# Patient Record
Sex: Female | Born: 1995 | Race: White | Hispanic: No | Marital: Single | Smoking: Never smoker
Health system: Southern US, Community
[De-identification: ages and names within clinical notes are randomized; demographics above are authoritative.]

---

## 2019-09-17 ENCOUNTER — Other Ambulatory Visit: Payer: Self-pay

## 2019-09-19 ENCOUNTER — Other Ambulatory Visit: Payer: Self-pay

## 2019-09-20 ENCOUNTER — Other Ambulatory Visit (HOSPITAL_COMMUNITY)
Admission: RE | Admit: 2019-09-20 | Discharge: 2019-09-20 | Disposition: A | Discharge status: Home / Self Care | Payer: BLUE CROSS/BLUE SHIELD | Source: Ambulatory Visit | Attending: Obstetrics and Gynecology | Admitting: Obstetrics and Gynecology

## 2019-09-20 ENCOUNTER — Encounter: Payer: Self-pay | Admitting: Obstetrics and Gynecology

## 2019-09-20 ENCOUNTER — Ambulatory Visit: Payer: BLUE CROSS/BLUE SHIELD | Admitting: Obstetrics and Gynecology

## 2019-09-20 VITALS — BP 116/60 | HR 72 | Temp 97.7°F | Resp 12 | Ht 68.5 in | Wt 135.0 lb

## 2019-09-20 DIAGNOSIS — Z0000 Encounter for general adult medical examination without abnormal findings: Secondary | ICD-10-CM | POA: Diagnosis not present

## 2019-09-20 DIAGNOSIS — Z113 Encounter for screening for infections with a predominantly sexual mode of transmission: Secondary | ICD-10-CM

## 2019-09-20 DIAGNOSIS — Z01419 Encounter for gynecological examination (general) (routine) without abnormal findings: Secondary | ICD-10-CM

## 2019-09-20 DIAGNOSIS — Z87898 Personal history of other specified conditions: Secondary | ICD-10-CM

## 2019-09-20 DIAGNOSIS — Z124 Encounter for screening for malignant neoplasm of cervix: Secondary | ICD-10-CM

## 2019-09-20 DIAGNOSIS — N914 Secondary oligomenorrhea: Secondary | ICD-10-CM

## 2019-09-20 MED ORDER — DROSPIRENONE-ETHINYL ESTRADIOL 3-0.02 MG PO TABS: 1.0000 | ORAL_TABLET | Freq: Every day | ORAL | 0 refills | Status: DC

## 2019-09-20 NOTE — Progress Notes (Signed)
23 y.o. G0P0000 Single White or Caucasian Not Hispanic or Latino female here as a new patient to discuss birth control. Patient has seen GYN in [facility] August 2019. Due for annual. Patient has irregular menses. Was taking Balziva in the past.  Sexually active, same partner x 1 month. Using condoms, no dyspareunia. Menses q 1-2 months, got irregular in the last few years. Eating normally, not excessively exercising. She has noticed some clear nipple d/c thinks from both sides. She has noticed some fluid on her bra or shirt. Started ~6 months ago.  +constipation. No other thyroid c/o.  Period Cycle (Days): (irregular) Period Duration (Days): 5 Period Pattern: (!) Irregular Menstrual Flow: Moderate Menstrual Control: Thin pad Menstrual Control Change Freq (Hours): 5 Dysmenorrhea: (!) Moderate Dysmenorrhea Symptoms: Cramping, Throbbing, Nausea, Diarrhea, Headache  Prior to her cycle she has moderate cramping, nausea, diarrhea or headache. On prior OCP's she had 2 weeks of spotting a month.   Patient's last menstrual period was 09/18/2019.          Sexually active: Yes.    The current method of family planning is condoms all of the time per patient.    Exercising: Yes.    workouts Smoker:  no  Health Maintenance: Pap:  never History of abnormal Pap:  n/a Colonoscopy: about 2013 normal  TDaP:  UTD Gardasil: completed series   reports that she has never smoked. She has never used smokeless tobacco. She reports current alcohol use of about 3.0 - 4.0 standard drinks of alcohol per week. She reports that she does not use drugs. 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 From [facility], [place].   History reviewed. No pertinent past medical history.  History reviewed. No pertinent surgical history.  No current outpatient medications on file.   No current facility-administered medications for this visit.     Family History  Problem Relation Age of Onset  . Cancer Maternal  Grandmother   . Cancer Maternal Grandfather   . Diabetes Paternal Grandmother   GM common bile duct cancer.   Review of Systems  Constitutional: Negative.   HENT: Negative.   Eyes: Negative.   Respiratory: Negative.   Cardiovascular: Negative.   Gastrointestinal: Negative.   Endocrine: Negative.   Genitourinary: Negative.   Musculoskeletal: Negative.   Skin: Negative.   Allergic/Immunologic: Negative.   Neurological: Negative.   Hematological: Negative.   Psychiatric/Behavioral: Negative.     Exam:   BP 116/60 (BP Location: Right Arm, Patient Position: Sitting, Cuff Size: Normal)   Pulse 72   Temp 97.7 F (36.5 C) (Temporal)   Resp 12   Ht 5' 8.5" (1.74 m)   Wt 135 lb (61.2 kg)   LMP 09/18/2019   BMI 20.23 kg/m   Weight change: @WEIGHTCHANGE @ Height:   Height: 5' 8.5" (174 cm)  Ht Readings from Last 3 Encounters:  09/20/19 5' 8.5" (1.74 m)    General appearance: alert, cooperative and appears stated age Head: Normocephalic, without obvious abnormality, atraumatic Neck: no adenopathy, supple, symmetrical, trachea midline and thyroid normal to inspection and palpation Lungs: clear to auscultation bilaterally Cardiovascular: regular rate and rhythm Breasts: normal appearance, no masses or tenderness, patient unable to express any discharge Abdomen: soft, non-tender; non distended,  no masses,  no organomegaly Extremities: extremities normal, atraumatic, no cyanosis or edema Skin: Skin color, texture, turgor normal. No rashes or lesions Lymph nodes: Cervical, supraclavicular, and axillary nodes normal. No abnormal inguinal nodes palpated Neurologic: Grossly normal   Pelvic:  External genitalia:  no lesions              Urethra:  normal appearing urethra with no masses, tenderness or lesions              Bartholins and Skenes: normal                 Vagina: normal appearing vagina with normal color and discharge, no lesions              Cervix: no lesions                Bimanual Exam:  Uterus:  normal size, contour, position, consistency, mobility, non-tender              Adnexa: no mass, fullness, tenderness               Rectovaginal: Confirms               Anus:  normal sphincter tone, no lesions  Chaperone was present for exam.  A:  Well Woman with normal exam  Contraception counseling  Oligomenorrhea  Nipple d/c, clear, thinks bilateral. Unable to express  P:   UPT negative  Will try Yaz  Discussed risks of OCP's, possible increased risk of clots with Yaz. No contraindications to OCP's.  Continue to use condoms for STD protection  Prolactin, TSH  Screening labs  STD  Pap  Discussed breast self exam  Discussed calcium and vit D intake

## 2019-09-20 NOTE — Patient Instructions (Addendum)
EXERCISE AND DIET:  We recommended that you start or continue a regular exercise program for good health. Regular exercise means any activity that makes your heart beat faster and makes you sweat.  We recommend exercising at least 30 minutes per day at least 3 days a week, preferably 4 or 5.  We also recommend a diet low in fat and sugar.  Inactivity, poor dietary choices and obesity can cause diabetes, heart attack, stroke, and kidney damage, among others.   ° °ALCOHOL AND SMOKING:  Women should limit their alcohol intake to no more than 7 drinks/beers/glasses of wine (combined, not each!) per week. Moderation of alcohol intake to this level decreases your risk of breast cancer and liver damage. And of course, no recreational drugs are part of a healthy lifestyle.  And absolutely no smoking or even second hand smoke. Most people know smoking can cause heart and lung diseases, but did you know it also contributes to weakening of your bones? Aging of your skin?  Yellowing of your teeth and nails? ° °CALCIUM AND VITAMIN D:  Adequate intake of calcium and Vitamin D are recommended.  The recommendations for exact amounts of these supplements seem to change often, but generally speaking 1,000 mg of calcium (between diet and supplement) and 800 units of Vitamin D per day seems prudent. Certain women may benefit from higher intake of Vitamin D.  If you are among these women, your doctor will have told you during your visit.   ° °PAP SMEARS:  Pap smears, to check for cervical cancer or precancers,  have traditionally been done yearly, although recent scientific advances have shown that most women can have pap smears less often.  However, every woman still should have a physical exam from her gynecologist every year. It will include a breast check, inspection of the vulva and vagina to check for abnormal growths or skin changes, a visual exam of the cervix, and then an exam to evaluate the size and shape of the uterus and  ovaries.  And after 21 years of age, a rectal exam is indicated to check for rectal cancers. We will also provide age appropriate advice regarding health maintenance, like when you should have certain vaccines, screening for sexually transmitted diseases, bone density testing, colonoscopy, mammograms, etc.  ° °MAMMOGRAMS:  All women over 40 years old should have a yearly mammogram. Many facilities now offer a "3D" mammogram, which may cost around $50 extra out of pocket. If possible,  we recommend you accept the option to have the 3D mammogram performed.  It both reduces the number of women who will be called back for extra views which then turn out to be normal, and it is better than the routine mammogram at detecting truly abnormal areas.   ° °COLON CANCER SCREENING: Now recommend starting at age 45. At this time colonoscopy is not covered for routine screening until 50. There are take home tests that can be done between 45-49.  ° °COLONOSCOPY:  Colonoscopy to screen for colon cancer is recommended for all women at age 50.  We know, you hate the idea of the prep.  We agree, BUT, having colon cancer and not knowing it is worse!!  Colon cancer so often starts as a polyp that can be seen and removed at colonscopy, which can quite literally save your life!  And if your first colonoscopy is normal and you have no family history of colon cancer, most women don't have to have it again for   10 years.  Once every ten years, you can do something that may end up saving your life, right?  We will be happy to help you get it scheduled when you are ready.  Be sure to check your insurance coverage so you understand how much it will cost.  It may be covered as a preventative service at no cost, but you should check your particular policy.   ° ° ° °Breast Self-Awareness °Breast self-awareness means being familiar with how your breasts look and feel. It involves checking your breasts regularly and reporting any changes to your  health care provider. °Practicing breast self-awareness is important. A change in your breasts can be a sign of a serious medical problem. Being familiar with how your breasts look and feel allows you to find any problems early, when treatment is more likely to be successful. All women should practice breast self-awareness, including women who have had breast implants. °How to do a breast self-exam °One way to learn what is normal for your breasts and whether your breasts are changing is to do a breast self-exam. To do a breast self-exam: °Look for Changes ° °1. Remove all the clothing above your waist. °2. Stand in front of a mirror in a room with good lighting. °3. Put your hands on your hips. °4. Push your hands firmly downward. °5. Compare your breasts in the mirror. Look for differences between them (asymmetry), such as: °? Differences in shape. °? Differences in size. °? Puckers, dips, and bumps in one breast and not the other. °6. Look at each breast for changes in your skin, such as: °? Redness. °? Scaly areas. °7. Look for changes in your nipples, such as: °? Discharge. °? Bleeding. °? Dimpling. °? Redness. °? A change in position. °Feel for Changes °Carefully feel your breasts for lumps and changes. It is best to do this while lying on your back on the floor and again while sitting or standing in the shower or tub with soapy water on your skin. Feel each breast in the following way: °· Place the arm on the side of the breast you are examining above your head. °· Feel your breast with the other hand. °· Start in the nipple area and make ¾ inch (2 cm) overlapping circles to feel your breast. Use the pads of your three middle fingers to do this. Apply light pressure, then medium pressure, then firm pressure. The light pressure will allow you to feel the tissue closest to the skin. The medium pressure will allow you to feel the tissue that is a little deeper. The firm pressure will allow you to feel the tissue  close to the ribs. °· Continue the overlapping circles, moving downward over the breast until you feel your ribs below your breast. °· Move one finger-width toward the center of the body. Continue to use the ¾ inch (2 cm) overlapping circles to feel your breast as you move slowly up toward your collarbone. °· Continue the up and down exam using all three pressures until you reach your armpit. ° °Write Down What You Find ° °Write down what is normal for each breast and any changes that you find. Keep a written record with breast changes or normal findings for each breast. By writing this information down, you do not need to depend only on memory for size, tenderness, or location. Write down where you are in your menstrual cycle, if you are still menstruating. °If you are having trouble noticing differences   in your breasts, do not get discouraged. With time you will become more familiar with the variations in your breasts and more comfortable with the exam. °How often should I examine my breasts? °Examine your breasts every month. If you are breastfeeding, the best time to examine your breasts is after a feeding or after using a breast pump. If you menstruate, the best time to examine your breasts is 5-7 days after your period is over. During your period, your breasts are lumpier, and it may be more difficult to notice changes. °When should I see my health care provider? °See your health care provider if you notice: °· A change in shape or size of your breasts or nipples. °· A change in the skin of your breast or nipples, such as a reddened or scaly area. °· Unusual discharge from your nipples. °· A lump or thick area that was not there before. °· Pain in your breasts. °· Anything that concerns you. ° °Oral Contraception Information °Oral contraceptive pills (OCPs) are medicines taken to prevent pregnancy. OCPs are taken by mouth, and they work by: °· Preventing the ovaries from releasing eggs. °· Thickening mucus in  the lower part of the uterus (cervix), which prevents sperm from entering the uterus. °· Thinning the lining of the uterus (endometrium), which prevents a fertilized egg from attaching to the endometrium. °OCPs are highly effective when taken exactly as prescribed. However, OCPs do not prevent STIs (sexually transmitted infections). Safe sex practices, such as using condoms while on an OCP, can help prevent STIs. °Before starting OCPs °Before you start taking OCPs, you may have a physical exam, blood test, and Pap test. However, you are not required to have a pelvic exam in order to be prescribed OCPs. Your health care provider will make sure you are a good candidate for oral contraception. OCPs are not a good option for certain women, including women who smoke and are older than 35 years, and women with a medical history of high blood pressure, deep vein thrombosis, pulmonary embolism, stroke, cardiovascular disease, or peripheral vascular disease. °Discuss with your health care provider the possible side effects of the OCP you may be prescribed. When you start an OCP, be aware that it can take 2-3 months for your body to adjust to changes in hormone levels. °Follow instructions from your health care provider about how to start taking your first cycle of OCPs. Depending on when you start the pill, you may need to use a backup form of birth control, such as condoms, during the first week. Make sure you know what steps to take if you ever forget to take the pill. °Types of oral contraception ° °The most common types of birth control pills contain the hormones estrogen and progestin (synthetic progesterone) or progestin only. °The combination pill °This type of pill contains estrogen and progestin hormones. Combination pills often come in packs of 21, 28, or 91 pills. For each pack, the last 7 pills may not contain hormones, which means you may stop taking the pills for 7 days. Menstrual bleeding occurs during the  week that you do not take the pills or that you take the pills with no hormones in them. °The minipill °This type of pill contains the progestin hormone only. It comes in packs of 28 pills. All 28 pills contain the hormone. You take the pill every day. It is very important to take the pill at the same time each day. °Advantages of oral contraceptive pills °·   Provides reliable and continuous contraception if taken as instructed. °· May treat or decrease symptoms of: °? Menstrual period cramps. °? Irregular menstrual cycle or bleeding. °? Heavy menstrual flow. °? Abnormal uterine bleeding. °? Acne, depending on the type of pill. °? Polycystic ovarian syndrome. °? Endometriosis. °? Iron deficiency anemia. °? Premenstrual symptoms, including premenstrual dysphoric disorder. °· May reduce the risk of endometrial and ovarian cancer. °· Can be used as emergency contraception. °· Prevents mislocated (ectopic) pregnancies and infections of the fallopian tubes. °Things that can make oral contraceptive pills less effective °OCPs can be less effective if: °· You forget to take the pill at the same time every day. This is especially important when taking the minipill. °· You have a stomach or intestinal disease that reduces your body's ability to absorb the pill. °· You take OCPs with other medicines that make OCPs less effective, such as antibiotics, certain HIV medicines, and some seizure medicines. °· You take expired OCPs. °· You forget to restart the pill on day 7, if using the packs of 21 pills. °Risks associated with oral contraceptive pills °Oral contraceptive pills can sometimes cause side effects, such as: °· Headache. °· Depression. °· Trouble sleeping. °· Nausea and vomiting. °· Breast tenderness. °· Irregular bleeding or spotting during the first several months. °· Bloating or fluid retention. °· Increase in blood pressure. °Combination pills are also associated with a small increase in the risk of: °· Blood  clots. °· Heart attack. °· Stroke. °Summary °· Oral contraceptive pills are medicines taken by mouth to prevent pregnancy. They are highly effective when taken exactly as prescribed. °· The most common types of birth control pills contain the hormones estrogen and progestin (synthetic progesterone) or progestin only. °· Before you start taking the pill, you may have a physical exam, blood test, and Pap test. Your health care provider will make sure you are a good candidate for oral contraception. °· The combination pill may come in a 21-day pack, a 28-day pack, or a 91-day pack. The minipill contains the progesterone hormone only and comes in packs of 28 pills. °· Oral contraceptive pills can sometimes cause side effects, such as headache, nausea, breast tenderness, or irregular bleeding. °This information is not intended to replace advice given to you by your health care provider. Make sure you discuss any questions you have with your health care provider. °Document Released: 02/19/2003 Document Revised: 11/11/2017 Document Reviewed: 02/22/2017 °Elsevier Patient Education © 2020 Elsevier Inc. ° °

## 2019-09-21 LAB — TSH: TSH: 2.07 u[IU]/mL (ref 0.450–4.500)

## 2019-09-21 LAB — RPR: RPR Ser Ql: NONREACTIVE

## 2019-09-21 LAB — CBC
Hematocrit: 38.9 % (ref 34.0–46.6)
Hemoglobin: 12.7 g/dL (ref 11.1–15.9)
MCH: 30.3 pg (ref 26.6–33.0)
MCHC: 32.6 g/dL (ref 31.5–35.7)
MCV: 93 fL (ref 79–97)
Platelets: 286 10*3/uL (ref 150–450)
RBC: 4.19 x10E6/uL (ref 3.77–5.28)
RDW: 12.9 % (ref 11.7–15.4)
WBC: 5.2 10*3/uL (ref 3.4–10.8)

## 2019-09-21 LAB — COMPREHENSIVE METABOLIC PANEL
ALT: 12 IU/L (ref 0–32)
AST: 18 IU/L (ref 0–40)
Albumin/Globulin Ratio: 1.9 (ref 1.2–2.2)
Albumin: 4.6 g/dL (ref 3.9–5.0)
Alkaline Phosphatase: 45 IU/L (ref 39–117)
BUN/Creatinine Ratio: 12 (ref 9–23)
BUN: 10 mg/dL (ref 6–20)
Bilirubin Total: 0.3 mg/dL (ref 0.0–1.2)
CO2: 24 mmol/L (ref 20–29)
Calcium: 10.1 mg/dL (ref 8.7–10.2)
Chloride: 101 mmol/L (ref 96–106)
Creatinine, Ser: 0.81 mg/dL (ref 0.57–1.00)
GFR calc Af Amer: 120 mL/min/{1.73_m2} (ref >59)
GFR calc non Af Amer: 104 mL/min/{1.73_m2} (ref >59)
Globulin, Total: 2.4 g/dL (ref 1.5–4.5)
Glucose: 92 mg/dL (ref 65–99)
Potassium: 4.6 mmol/L (ref 3.5–5.2)
Sodium: 139 mmol/L (ref 134–144)
Total Protein: 7 g/dL (ref 6.0–8.5)

## 2019-09-21 LAB — LIPID PANEL
Chol/HDL Ratio: 3 ratio (ref 0.0–4.4)
Cholesterol, Total: 169 mg/dL (ref 100–199)
HDL: 57 mg/dL (ref >39)
LDL Chol Calc (NIH): 93 mg/dL (ref 0–99)
Triglycerides: 106 mg/dL (ref 0–149)
VLDL Cholesterol Cal: 19 mg/dL (ref 5–40)

## 2019-09-21 LAB — PROLACTIN: Prolactin: 57 ng/mL — ABNORMAL HIGH (ref 4.8–23.3)

## 2019-09-21 LAB — HIV ANTIBODY (ROUTINE TESTING W REFLEX): HIV Screen 4th Generation wRfx: NONREACTIVE

## 2019-09-24 ENCOUNTER — Telehealth: Payer: Self-pay

## 2019-09-24 DIAGNOSIS — N914 Secondary oligomenorrhea: Secondary | ICD-10-CM

## 2019-09-24 DIAGNOSIS — R7989 Other specified abnormal findings of blood chemistry: Secondary | ICD-10-CM

## 2019-09-24 DIAGNOSIS — Z87898 Personal history of other specified conditions: Secondary | ICD-10-CM

## 2019-09-24 NOTE — Telephone Encounter (Signed)
-----   Message from Salvadore Dom, MD sent at 09/21/2019  3:42 PM EDT ----- Please let the patient know that her prolactin level is elevated, this goes along with her irregular menses and nipple discharge. Prolactin is a hormone secreted from the brain. When the level is elevated we check a MRI to look for a benign tumor (typically just treated with medication). Please set her up for an MRI of her pituitary with contrast.  The rest of her blood work is normal. The pap and cervical cultures are pending.

## 2019-09-24 NOTE — Telephone Encounter (Signed)
Left message to call Kaitlyn at [phone]. 

## 2019-09-26 NOTE — Telephone Encounter (Signed)
Patient placed in IMG hold.  

## 2019-09-26 NOTE — Telephone Encounter (Signed)
Spoke with patient. Results given. Patient verbalizes understanding. Order for Brain MRI W WO contrast placed for preauthorization and scheduling. Patient is aware she will be contacted directly to schedule appointment.  Cc: Lerry Liner for preauthorization Cc: Glorianne Manchester, RN for imaging hold  Routing to provider and will close encounter.

## 2019-10-01 LAB — CYTOLOGY - PAP
Chlamydia: NEGATIVE
Comment: NEGATIVE
Comment: NEGATIVE
Comment: NORMAL
Diagnosis: NEGATIVE
Neisseria Gonorrhea: NEGATIVE
Trichomonas: NEGATIVE

## 2019-10-18 ENCOUNTER — Telehealth: Payer: Self-pay | Admitting: Obstetrics and Gynecology

## 2019-10-18 NOTE — Telephone Encounter (Signed)
Taron with [facility] called regarding appointment for patient. She is scheduled on 10/23/19 and authorization is needed. [id number].

## 2019-10-18 NOTE — Telephone Encounter (Signed)
Routing to [name].  

## 2019-10-18 NOTE — Telephone Encounter (Signed)
See previous messages. Call placed to [facility]. Per [facility] [phone] representative Caffie Pinto and Team Leader/Manager Jon Gills (call reference number [id number]) CPT code [phone] does NOT require prior approval. Vinie Sill states this information is "well documented and recorded in their files"  I have also spoken with Taron with [facility] and conveyed this information.   Will close encounter   cc: Glorianne Manchester, RN

## 2019-10-23 ENCOUNTER — Other Ambulatory Visit: Payer: Self-pay

## 2019-10-23 ENCOUNTER — Ambulatory Visit
Admission: RE | Admit: 2019-10-23 | Discharge: 2019-10-23 | Disposition: A | Discharge status: Home / Self Care | Payer: BLUE CROSS/BLUE SHIELD | Source: Ambulatory Visit | Attending: Obstetrics and Gynecology | Admitting: Obstetrics and Gynecology

## 2019-10-23 DIAGNOSIS — N914 Secondary oligomenorrhea: Secondary | ICD-10-CM

## 2019-10-23 DIAGNOSIS — Z87898 Personal history of other specified conditions: Secondary | ICD-10-CM

## 2019-10-23 DIAGNOSIS — R7989 Other specified abnormal findings of blood chemistry: Secondary | ICD-10-CM

## 2019-10-23 MED ORDER — GADOBENATE DIMEGLUMINE 529 MG/ML IV SOLN
6.0000 mL | Freq: Once | INTRAVENOUS | Status: AC | PRN
  Administered 2019-10-23: 6 mL via INTRAVENOUS

## 2019-12-05 ENCOUNTER — Other Ambulatory Visit: Payer: Self-pay | Admitting: Obstetrics and Gynecology

## 2019-12-05 ENCOUNTER — Other Ambulatory Visit: Payer: Self-pay

## 2019-12-05 MED ORDER — DROSPIRENONE-ETHINYL ESTRADIOL 3-0.02 MG PO TABS: 1.0000 | ORAL_TABLET | Freq: Every day | ORAL | 0 refills | Status: DC

## 2019-12-05 NOTE — Telephone Encounter (Signed)
Med refill request: Gabrielle Martinez Last AEX: 09/20/2019 Next AEX: not scheduled Has  3 month recheck 01/09/2020  Last MMG (if hormonal med) n/a, but had Brain MRI, negative Refill authorized: # 1 package, 0 RF orders pended if approved. Per OV in 09/20/19, pt to try Gabrielle Martinez and will follow up for more RF???

## 2019-12-05 NOTE — Telephone Encounter (Signed)
Medication refill request: Yaz Last AEX:  09/20/19 JJ Next OV: 01/09/20  Last MMG (if hormonal medication request): n/a Refill authorized: Please advise if patient needs refill.

## 2019-12-10 ENCOUNTER — Telehealth: Payer: Self-pay | Admitting: Obstetrics and Gynecology

## 2019-12-10 NOTE — Telephone Encounter (Signed)
Patient needs refill on birth control. Is out of town for the holidays and would like it sent to the [facility] at [address], [place] [address].

## 2019-12-11 NOTE — Telephone Encounter (Signed)
Left detailed message per DPR  re: Rx. Made pt aware of Rx sent for OCPs  [facility] [place]. Instructed pt to call and transfer Rx to pharmacy that she is currently needing RF. Pt to call back if any questions or concerns.  Will route to Dr Talbert Nan for review and will close encounter.

## 2019-12-31 ENCOUNTER — Other Ambulatory Visit: Payer: Self-pay | Admitting: Obstetrics and Gynecology

## 2019-12-31 NOTE — Telephone Encounter (Signed)
Medication refill request: OCP Last AEX:  09-20-2019 JJ  Next OV for follow up: 01-09-20 Last MMG (if hormonal medication request): n/a Refill authorized: Today, please advise.   Medication pended for #28, 0RF. Please refill if appropriate.

## 2020-01-09 ENCOUNTER — Telehealth (INDEPENDENT_AMBULATORY_CARE_PROVIDER_SITE_OTHER): Payer: BLUE CROSS/BLUE SHIELD | Admitting: Obstetrics and Gynecology

## 2020-01-09 ENCOUNTER — Other Ambulatory Visit: Payer: Self-pay

## 2020-01-09 ENCOUNTER — Encounter: Payer: Self-pay | Admitting: Obstetrics and Gynecology

## 2020-01-09 DIAGNOSIS — Z87898 Personal history of other specified conditions: Secondary | ICD-10-CM | POA: Diagnosis not present

## 2020-01-09 DIAGNOSIS — Z708 Other sex counseling: Secondary | ICD-10-CM | POA: Diagnosis not present

## 2020-01-09 DIAGNOSIS — Z3041 Encounter for surveillance of contraceptive pills: Secondary | ICD-10-CM

## 2020-01-09 MED ORDER — DROSPIRENONE-ETHINYL ESTRADIOL 3-0.02 MG PO TABS: 1.0000 | ORAL_TABLET | Freq: Every day | ORAL | 2 refills | Status: DC

## 2020-01-09 NOTE — Progress Notes (Signed)
Virtual Visit via Video Note  I connected with Gabrielle Martinez on 01/09/20 at 10:00 AM EST by a video enabled telemedicine application and verified that I am speaking with the correct person using two identifiers.  Location: Patient: In her apartment at school in [facility] Provider: Office at [facility]   I discussed the limitations of evaluation and management by telemedicine and the availability of in person appointments. The patient expressed understanding and agreed to proceed.  GYNECOLOGY  VISIT   HPI: 24 y.o.   Single White or Caucasian Not Hispanic or Latino  female   G0P0000 with No LMP recorded.   here for f/u medication check. She was started [place] in 10/20. She is getting a cycle at the end of her pill pack every month. She is bleeding for 3 days. It has gotten very light, only spotting. Cramps are getting better, currently minimal. She c/o mild acne on the pill (not previously). She is sexually active with the same partner, not consistently using condoms.  At her annual she was c/o oligomenorrhea and clear nipple d/c. Her prolactin level was 57. Brain MRI was normal. Her nipple d/c seems to have stopped.   GYNECOLOGIC HISTORY: No LMP recorded. Contraception:Yaz Menopausal hormone therapy: none        OB History    Gravida  0   Para  0   Term  0   Preterm  0   AB  0   Living  0     SAB  0   TAB  0   Ectopic  0   Multiple  0   Live Births  0              There are no problems to display for this patient.   No past medical history on file.  No past surgical history on file.  Current Outpatient Medications  Medication Sig Dispense Refill  . GIANVI 3-0.02 MG tablet TAKE 1 TABLET BY MOUTH EVERY DAY 28 tablet 0   No current facility-administered medications for this visit.     ALLERGIES: Patient has no known allergies.  Family History  Problem Relation Age of Onset  . Cancer Maternal Grandmother   . Cancer Maternal  Grandfather   . Diabetes Paternal Grandmother     Social History   Socioeconomic History  . Marital status: Single    Spouse name: Not on file  . Number of children: Not on file  . Years of education: Not on file  . Highest education level: Not on file  Occupational History  . Not on file  Tobacco Use  . Smoking status: Never Smoker  . Smokeless tobacco: Never Used  Substance and Sexual Activity  . Alcohol use: Yes    Alcohol/week: 3.0 - 4.0 standard drinks    Types: 3 - 4 Standard drinks or equivalent per week  . Drug use: Never  . Sexual activity: Yes    Birth control/protection: Condom  Other Topics Concern  . Not on file  Social History Narrative  . Not on file   Social Determinants of Health   Financial Resource Strain:   . Difficulty of Paying Living Expenses: Not on file  Food Insecurity:   . Worried About Programme researcher, broadcasting/film/video in the Last Year: Not on file  . Ran Out of Food in the Last Year: Not on file  Transportation Needs:   . Lack of Transportation (Medical): Not on file  . Lack of Transportation (  Non-Medical): Not on file  Physical Activity:   . Days of Exercise per Week: Not on file  . Minutes of Exercise per Session: Not on file  Stress:   . Feeling of Stress : Not on file  Social Connections:   . Frequency of Communication with Friends and Family: Not on file  . Frequency of Social Gatherings with Friends and Family: Not on file  . Attends Religious Services: Not on file  . Active Member of Clubs or Organizations: Not on file  . Attends Archivist Meetings: Not on file  . Marital Status: Not on file  Intimate Partner Violence:   . Fear of Current or Ex-Partner: Not on file  . Emotionally Abused: Not on file  . Physically Abused: Not on file  . Sexually Abused: Not on file    ROS  PHYSICAL EXAMINATION:    There were no vitals taken for this visit.    General appearance: alert, cooperative and appears stated age  Chaperone was  present for exam.  ASSESSMENT Contraception surveillance, doing well on yaz H/O elevated prolactin, nipple d/c, normal MRI. Nipple d/c has stopped Discussed condoms for STD protection   PLAN Continue Yaz Don't squeeze her nipple to check for d/c Call with any concerns Condom use was encouraged for STD protection    I discussed the assessment and treatment plan with the patient. The patient was provided an opportunity to ask questions and all were answered. The patient agreed with the plan and demonstrated an understanding of the instructions.   The patient was advised to call back or seek an in-person evaluation if the symptoms worsen or if the condition fails to improve as anticipated.  I provided over 10 minutes of total patient care  Salvadore Dom, MD

## 2020-03-20 IMAGING — MR MR HEAD WO/W CM
12 of 16 series · 35 of 48 positions shown · IV contrast (6 ml Multihance)
Comparison: None.

CLINICAL DATA: Hyperprolactinemia

EXAM:
MRI HEAD WITHOUT AND WITH CONTRAST
TECHNIQUE: Multiplanar, multiecho pulse sequences of the brain and surrounding
structures were obtained without and with intravenous contrast.
CONTRAST:  6mL MULTIHANCE GADOBENATE DIMEGLUMINE 529 MG/ML IV SOLN

[Series 4: DWI · axial · 3.0mm · 1.80mm/px · z∈[-36,+110]mm · 9 of 100 slices shown]
[im 1/100]
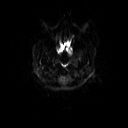
[im 17/100]
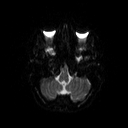
[im 34/100]
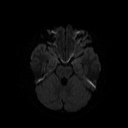
[im 42/100]
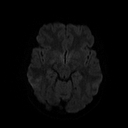
[im 50/100]
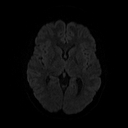
[im 58/100]
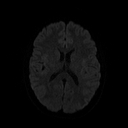
[im 67/100]
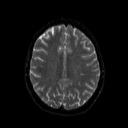
[im 83/100]
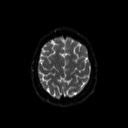
[im 100/100]
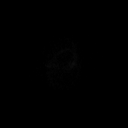

[Series 5: dwi_adc · axial · 3.0mm · 1.80mm/px · z∈[-36,+110]mm · 6 of 50 slices shown]
[im 1/50]
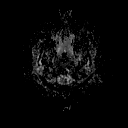
[im 10/50]
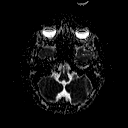
[im 20/50]
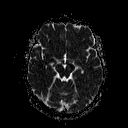
[im 30/50]
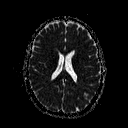
[im 40/50]
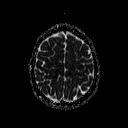
[im 50/50]
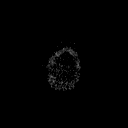

[Series 6: T2 · axial · 5.0mm · 0.36mm/px · z∈[-48,+88]mm · 3 of 22 slices shown]
[im 1/22]
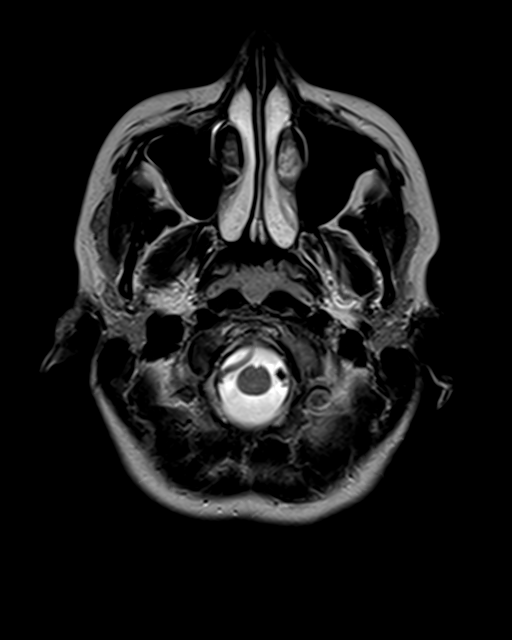
[im 11/22]
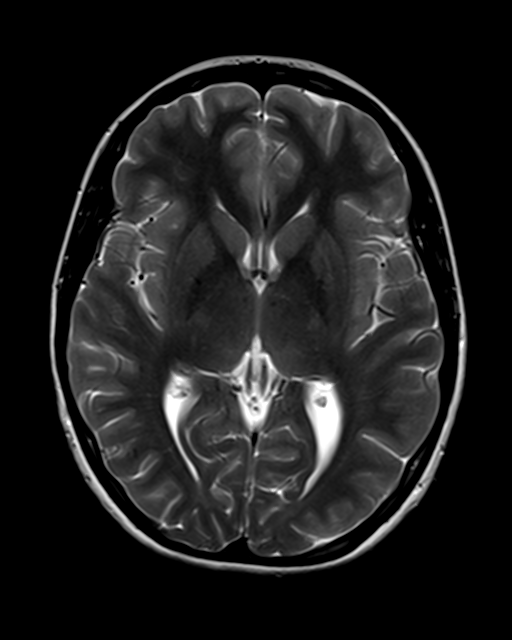
[im 22/22]
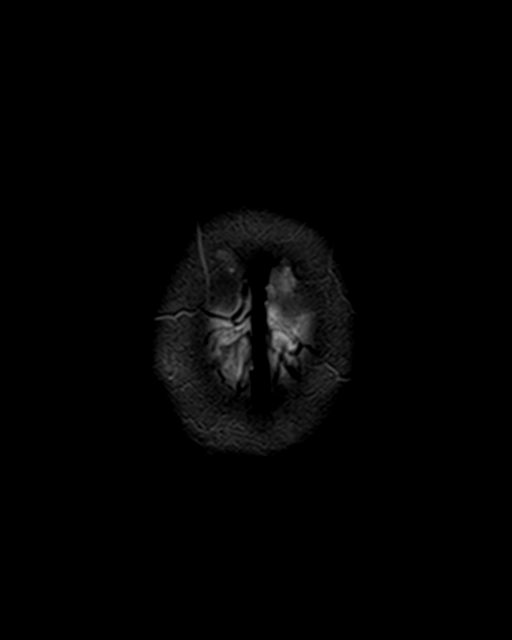

[Series 7: FLAIR · axial · 3.0mm · 0.45mm/px · z∈[-52,+91]mm · 4 of 30 slices shown]
[im 1/30]
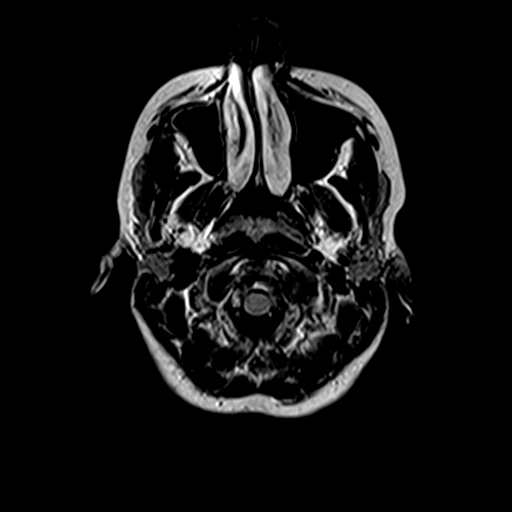
[im 10/30]
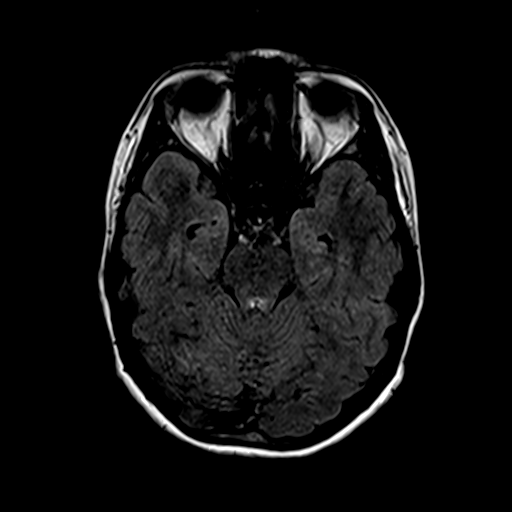
[im 20/30]
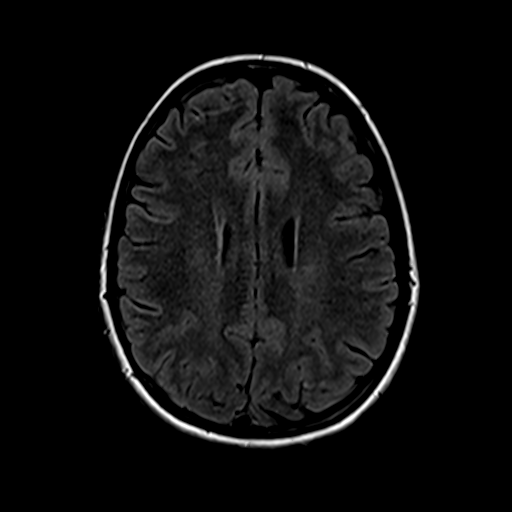
[im 30/30]
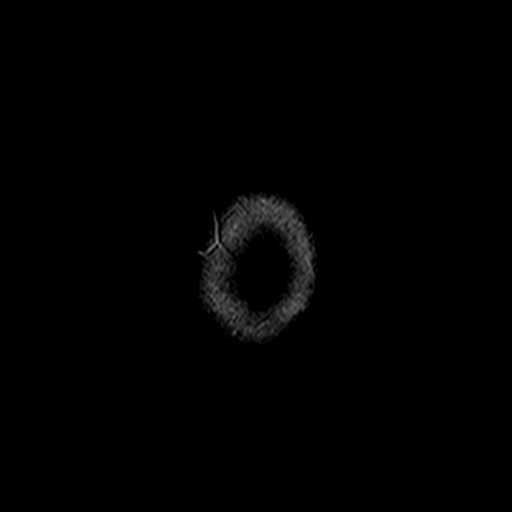

[Series 9: swi_images · axial · 5.0mm · 0.94mm/px · z∈[-48,+87]mm · 4 of 28 slices shown]
[im 1/28]
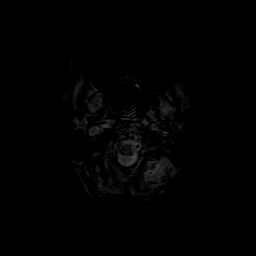
[im 10/28]
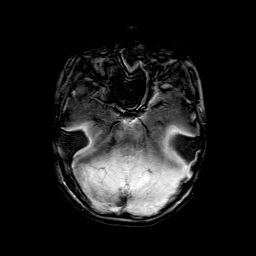
[im 19/28]
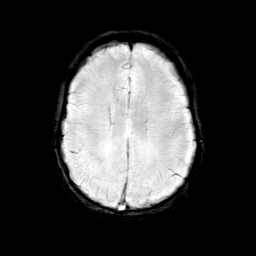
[im 28/28]
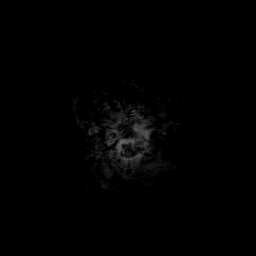

[Series 10: sag 3mm · sagittal · 3.0mm · 0.33mm/px · 2 of 11 slices shown]
[im 1/11]
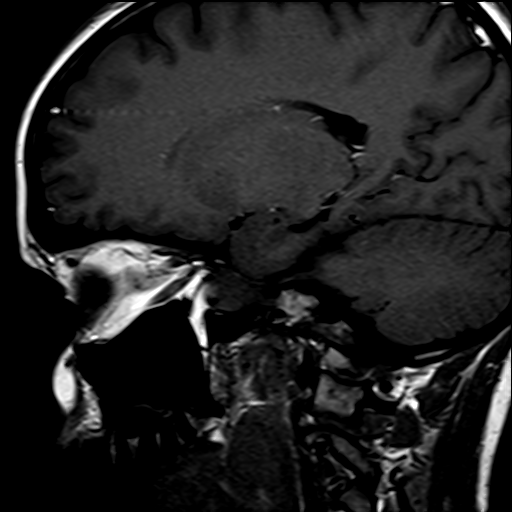
[im 11/11]
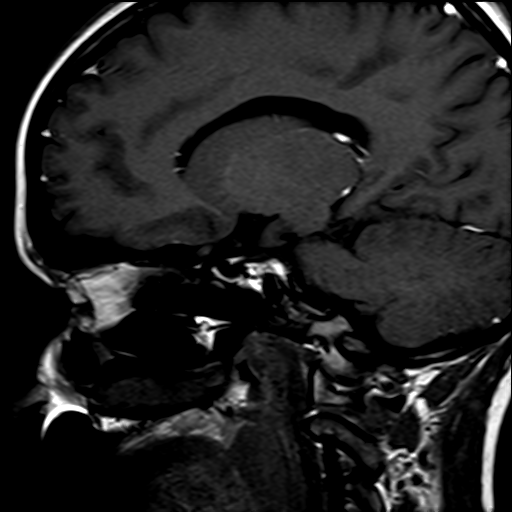

[Series 11: cor 3mm · coronal · 3.0mm · 0.33mm/px · 2 of 11 slices shown]
[im 1/11]
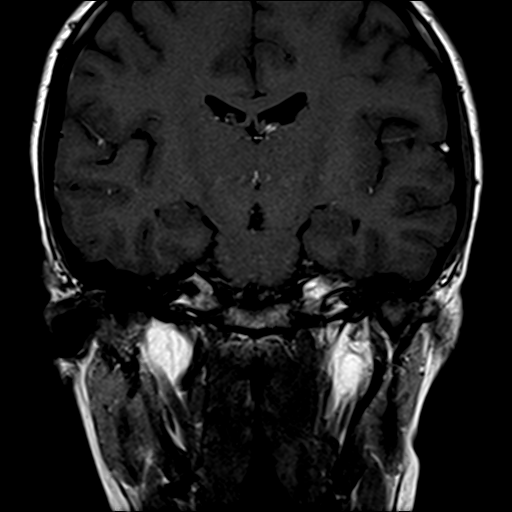
[im 11/11]
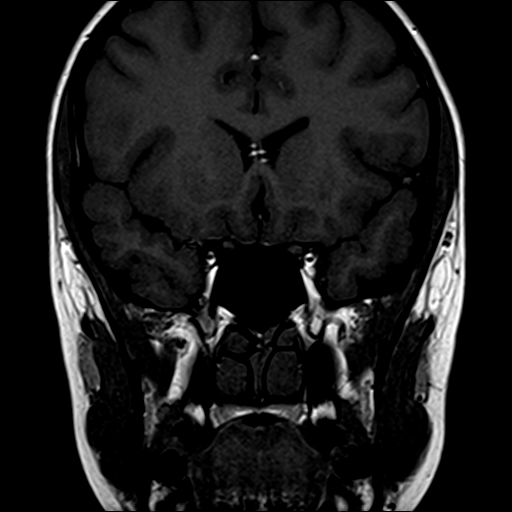

[Series 12: pre cor dynamic · coronal · non-contrast · 3.0mm · 0.35mm/px · 1 of 7 slices shown]
[im 1/7]
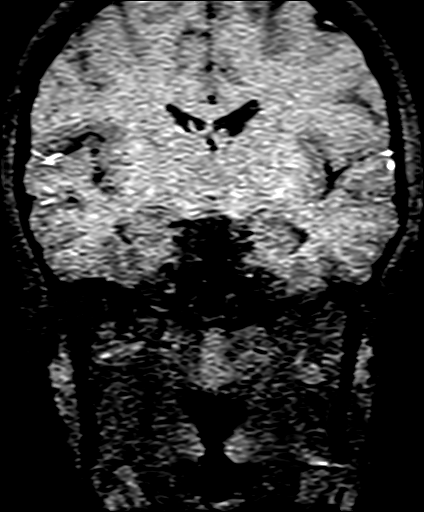

[Series 13: post fs cor · coronal · 3.0mm · 0.35mm/px · 1 of 7 slices shown (1 of 4)]
[im 1/7]
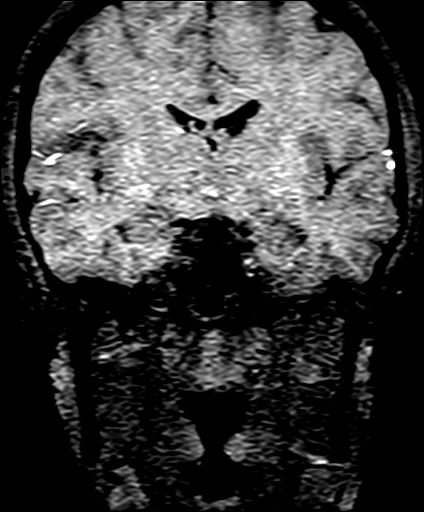

[Series 14: post fs cor · coronal · 3.0mm · 0.35mm/px · 1 of 7 slices shown (2 of 4)]
[im 1/7]
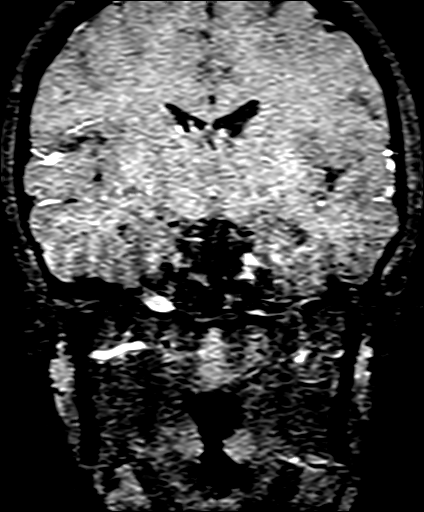

[Series 16: post fs cor · coronal · 3.0mm · 0.35mm/px · 1 of 7 slices shown (3 of 4)]
[im 1/7]
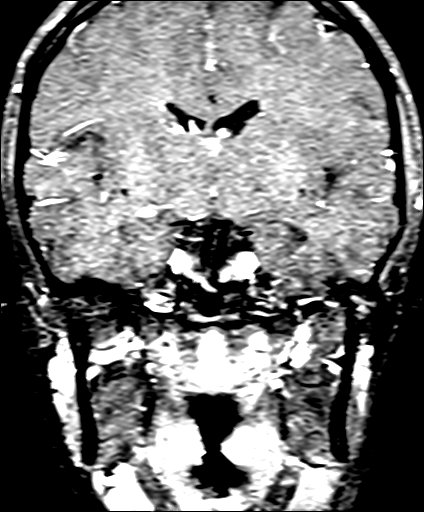

[Series 17: post fs cor · coronal · 3.0mm · 0.35mm/px · 1 of 7 slices shown (4 of 4)]
[im 1/7]
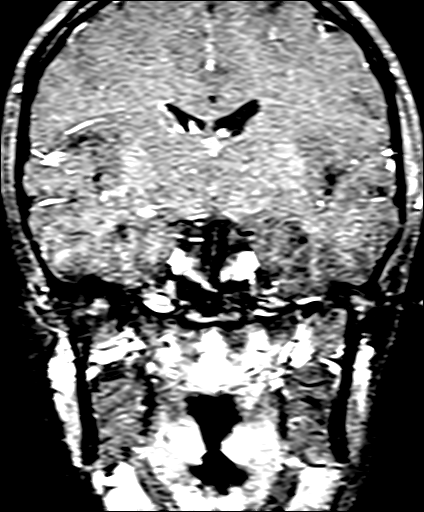

[35 of 48 positions shown; findings below may reference images not displayed]

FINDINGS: BRAIN: There is no acute infarct, acute hemorrhage or extra-axial
collection. The white matter signal is normal for the patient's age.
The cerebral and cerebellar volume are age-appropriate. There is no
hydrocephalus. The midline structures are normal.

Pituitary/Sella: The pituitary gland is normal in appearance without
mass lesion. A normal posterior pituitary bright spot is seen. The
infundibulum is midline. The hypothalamus and mamillary bodies are
normal. There is no mass effect on the optic chiasm or optic nerves.
The infundibular and chiasmatic recesses are clear. Normal cavernous
sinus and cavernous internal carotid artery flow voids.

VASCULAR: The major intracranial arterial and venous sinus flow
voids are normal. Susceptibility-sensitive sequences show no chronic
microhemorrhage or superficial siderosis.

SKULL AND UPPER CERVICAL SPINE: Calvarial bone marrow signal is
normal. There is no skull base mass. The visualized upper cervical
spine and soft tissues are normal.

SINUSES/ORBITS: There are no fluid levels or advanced mucosal
thickening. The mastoid air cells and middle ear cavities are free
of fluid. The orbits are normal.
IMPRESSION: Normal MRI of the brain and pituitary gland.

## 2020-09-10 ENCOUNTER — Other Ambulatory Visit: Payer: Self-pay | Admitting: Obstetrics and Gynecology

## 2020-09-10 NOTE — Telephone Encounter (Signed)
Medication refill request: Lowella Bandy  Last AEX:  09-20-2019 JJ  Next AEX: detailed message left for patient to call and scheduled  Last MMG (if hormonal medication request): n/a Refill authorized: Today, please advise.   Medication pended for #28, 0RF. Please refill if appropriate.

## 2020-10-07 ENCOUNTER — Other Ambulatory Visit: Payer: Self-pay | Admitting: Obstetrics and Gynecology

## 2020-10-16 ENCOUNTER — Other Ambulatory Visit: Payer: Self-pay

## 2020-10-16 DIAGNOSIS — Z3041 Encounter for surveillance of contraceptive pills: Secondary | ICD-10-CM

## 2020-10-16 MED ORDER — DROSPIRENONE-ETHINYL ESTRADIOL 3-0.02 MG PO TABS: 1.0000 | ORAL_TABLET | Freq: Every day | ORAL | 0 refills | Status: DC

## 2020-10-16 NOTE — Telephone Encounter (Signed)
Medication refill request: Nikki 3-0.02mg   Last OV:  01/09/20  Next AEX: 11/14/20  Last MMG (if hormonal medication request): NA Refill authorized: 28/0

## 2020-10-16 NOTE — Telephone Encounter (Signed)
Patient requesting refill for birth control that was denied. She has aex scheduled for 11/14/20.

## 2020-11-05 ENCOUNTER — Other Ambulatory Visit: Payer: Self-pay | Admitting: Obstetrics and Gynecology

## 2020-11-05 DIAGNOSIS — Z3041 Encounter for surveillance of contraceptive pills: Secondary | ICD-10-CM

## 2020-11-05 NOTE — Telephone Encounter (Signed)
Medication refill request: Nikki 3 - 0.02mg   Last AEX:  09/20/19 Next AEX: 11/14/20 Last MMG (if hormonal medication request): NA Refill authorized: 28/0

## 2020-11-14 ENCOUNTER — Other Ambulatory Visit: Payer: Self-pay

## 2020-11-14 ENCOUNTER — Ambulatory Visit (INDEPENDENT_AMBULATORY_CARE_PROVIDER_SITE_OTHER): Payer: BLUE CROSS/BLUE SHIELD | Admitting: Obstetrics and Gynecology

## 2020-11-14 ENCOUNTER — Encounter: Payer: Self-pay | Admitting: Obstetrics and Gynecology

## 2020-11-14 VITALS — BP 100/60 | HR 66 | Resp 14 | Ht 69.5 in | Wt 129.4 lb

## 2020-11-14 DIAGNOSIS — N941 Unspecified dyspareunia: Secondary | ICD-10-CM | POA: Diagnosis not present

## 2020-11-14 DIAGNOSIS — Z01419 Encounter for gynecological examination (general) (routine) without abnormal findings: Secondary | ICD-10-CM

## 2020-11-14 DIAGNOSIS — R7989 Other specified abnormal findings of blood chemistry: Secondary | ICD-10-CM | POA: Diagnosis not present

## 2020-11-14 DIAGNOSIS — Z113 Encounter for screening for infections with a predominantly sexual mode of transmission: Secondary | ICD-10-CM

## 2020-11-14 DIAGNOSIS — Z3041 Encounter for surveillance of contraceptive pills: Secondary | ICD-10-CM

## 2020-11-14 MED ORDER — LIDOCAINE 5 % EX OINT: TOPICAL_OINTMENT | CUTANEOUS | 0 refills | Status: DC

## 2020-11-14 MED ORDER — DROSPIRENONE-ETHINYL ESTRADIOL 3-0.02 MG PO TABS: 1.0000 | ORAL_TABLET | Freq: Every day | ORAL | 3 refills | Status: DC

## 2020-11-14 NOTE — Progress Notes (Signed)
24 y.o. G0P0000 Single White or Caucasian Not Hispanic or Latino female here for annual exam.  On OCP's.  Period Cycle (Days): 28 Period Duration (Days): 3-4 Period Pattern: Regular Menstrual Flow: Light, Moderate Menstrual Control: Maxi pad Menstrual Control Change Freq (Hours): changes pad every 3-4 horus Dysmenorrhea: None   She c/o entry dyspareunia for the last few months. It varies from mild to moderate. Uses lubrication sometimes. No baseline pain. Same partner for over a year.  No vaginitis c/o.  Last year the patient had nipple d/c, an elevated prolactin level and a normal brain MRI.   Patient's last menstrual period was 11/13/2020.          Sexually active: Yes.    The current method of family planning is OCP (estrogen/progesterone).    Exercising: Yes.    cardio  Smoker:  no  Health Maintenance: Pap:  09-20-2019 negative  History of abnormal Pap:  no Colonoscopy: ~2014 normal per patient. Done for "stomach issues" TDaP:  03-19-10- declined today Gardasil: completed all 3    reports that she has never smoked. She has never used smokeless tobacco. She reports current alcohol use of about 3.0 - 4.0 standard drinks of alcohol per week. She reports that she does not use drugs. She graduated from [employer] earlier this year. 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   No past medical history on file.  No past surgical history on file.  Current Outpatient Medications  Medication Sig Dispense Refill  . NIKKI 3-0.02 MG tablet TAKE 1 TABLET BY MOUTH EVERY DAY 28 tablet 0   No current facility-administered medications for this visit.    Family History  Problem Relation Age of Onset  . Cancer Maternal Grandmother   . Cancer Maternal Grandfather   . Diabetes Paternal Grandmother     Review of Systems  All other systems reviewed and are negative.   Exam:   BP 100/60 (BP Location: Right Arm, Patient  Position: Sitting, Cuff Size: Normal)   Pulse 66   Resp 14   Ht 5' 9.5" (1.765 m)   Wt 129 lb 6.4 oz (58.7 kg)   LMP 11/13/2020   BMI 18.84 kg/m   Weight change: @WEIGHTCHANGE @ Height:   Height: 5' 9.5" (176.5 cm)  Ht Readings from Last 3 Encounters:  11/14/20 5' 9.5" (1.765 m)  09/20/19 5' 8.5" (1.74 m)    General appearance: alert, cooperative and appears stated age Head: Normocephalic, without obvious abnormality, atraumatic Neck: no adenopathy, supple, symmetrical, trachea midline and thyroid normal to inspection and palpation Lungs: clear to auscultation bilaterally Cardiovascular: regular rate and rhythm Breasts: normal appearance, no masses or tenderness Abdomen: soft, non-tender; non distended,  no masses,  no organomegaly Extremities: extremities normal, atraumatic, no cyanosis or edema Skin: Skin color, texture, turgor normal. No rashes or lesions Lymph nodes: Cervical, supraclavicular, and axillary nodes normal. No abnormal inguinal nodes palpated Neurologic: Grossly normal   Pelvic: External genitalia:  no lesions, no tenderness with palpation of a cotton swab around the vestibule.               Urethra:  normal appearing urethra with no masses, tenderness or lesions              Bartholins and Skenes: normal                 Vagina: normal appearing vagina with normal color and discharge, no lesions  Cervix: no lesions               Bimanual Exam:  Uterus:  normal size, contour, position, consistency, mobility, non-tender              Adnexa: no mass, fullness, tenderness               Rectovaginal: Confirms               Anus:  normal sphincter tone, no lesions  Shanon Petty chaperoned for the exam.  A:  Well Woman with normal exam  H/O elevated prolactin, normal MRI  Recent entry dyspareunia, no signs of vulvodynia on exam.  P:   No pap today  Discussed breast self exam  Discussed calcium and vit D intake  Prolactin  STD testing  Normal  screening labs last year  Discussed using lubrication with intercourse, increasing foreplay, controlling rate and depth of penetration  Lidocaine script given, will check vaginitis panel

## 2020-11-14 NOTE — Patient Instructions (Signed)

## 2020-11-15 LAB — RPR: RPR Ser Ql: NONREACTIVE

## 2020-11-15 LAB — HIV ANTIBODY (ROUTINE TESTING W REFLEX): HIV Screen 4th Generation wRfx: NONREACTIVE

## 2020-11-15 LAB — PROLACTIN: Prolactin: 36.9 ng/mL — ABNORMAL HIGH (ref 4.8–23.3)

## 2020-11-18 LAB — NUSWAB VAGINITIS PLUS (VG+)
Candida albicans, NAA: NEGATIVE
Candida glabrata, NAA: NEGATIVE
Chlamydia trachomatis, NAA: NEGATIVE
Neisseria gonorrhoeae, NAA: NEGATIVE
Trich vag by NAA: NEGATIVE

## 2021-11-07 ENCOUNTER — Other Ambulatory Visit: Payer: Self-pay | Admitting: Obstetrics and Gynecology

## 2021-11-07 DIAGNOSIS — Z3041 Encounter for surveillance of contraceptive pills: Secondary | ICD-10-CM

## 2021-11-09 NOTE — Telephone Encounter (Signed)
Annual exam scheduled on 11/23/21 

## 2021-11-17 NOTE — Progress Notes (Signed)
25 y.o. G0P0000 Single White or Caucasian Not Hispanic or Latino female here for annual exam.  Sexually active, same partner x 2 years. Live together.  Period Cycle (Days): 28 Period Duration (Days): 3-4 Period Pattern: Regular Menstrual Flow: Moderate Menstrual Control: Thin pad Menstrual Control Change Freq (Hours): 4 Dysmenorrhea: (!) Mild Dysmenorrhea Symptoms: Cramping  H/O elevated prolactin level, normal MRI, needs yearly blood work. No further nipple discharge.   Patient's last menstrual period was 11/16/2021.          Sexually active: Yes.    The current method of family planning is OCP (estrogen/progesterone).    Exercising: Yes.     Cardio and weights  Smoker:  no  Health Maintenance: Pap:  09-20-2019 negative  History of abnormal Pap:  no MMG:  none  BMD:   none  Colonoscopy:  ~2014 normal per patient. Done for "stomach issues" TDaP:  03/19/10  Gardasil: complete    reports that she has never smoked. She has never used smokeless tobacco. She reports current alcohol use of about 3.0 - 4.0 standard drinks per week. She reports that she does not use drugs. 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  History reviewed. No pertinent past medical history.  History reviewed. No pertinent surgical history.  Current Outpatient Medications  Medication Sig Dispense Refill   NIKKI 3-0.02 MG tablet TAKE 1 TABLET BY MOUTH EVERY DAY 84 tablet 0   No current facility-administered medications for this visit.    Family History  Problem Relation Age of Onset   Cancer Maternal Grandmother    Cancer Maternal Grandfather    Diabetes Paternal Grandmother     Review of Systems  All other systems reviewed and are negative.  Exam:   BP 110/62   Pulse 90   Ht 5\' 9"  (1.753 m)   Wt 129 lb (58.5 kg)   LMP 11/16/2021   SpO2 100%   BMI 19.05 kg/m   Weight change: @WEIGHTCHANGE @ Height:   Height: 5\' 9"  (175.3 cm)  Ht Readings from Last 3 Encounters:  11/23/21 5\' 9"   (1.753 m)  11/14/20 5' 9.5" (1.765 m)  09/20/19 5' 8.5" (1.74 m)    General appearance: alert, cooperative and appears stated age Head: Normocephalic, without obvious abnormality, atraumatic Neck: no adenopathy, supple, symmetrical, trachea midline and thyroid normal to inspection and palpation Lungs: clear to auscultation bilaterally Cardiovascular: regular rate and rhythm Breasts: normal appearance, no masses or tenderness Abdomen: soft, non-tender; non distended,  no masses,  no organomegaly Extremities: extremities normal, atraumatic, no cyanosis or edema Skin: Skin color, texture, turgor normal. No rashes or lesions Lymph nodes: Cervical, supraclavicular, and axillary nodes normal. No abnormal inguinal nodes palpated Neurologic: Grossly normal   Pelvic: External genitalia:  no lesions              Urethra:  normal appearing urethra with no masses, tenderness or lesions              Bartholins and Skenes: normal                 Vagina: normal appearing vagina with normal color and discharge, no lesions              Cervix: no lesions               Bimanual Exam:  Uterus:  normal size, contour, position, consistency, mobility, non-tender              Adnexa: no mass,  fullness, tenderness               Rectovaginal: Confirms               Anus:  normal sphincter tone, no lesions  Carolynn Serve chaperoned for the exam.  1. Well woman exam Discussed breast self exam Discussed calcium and vit D intake No screening labs needed today  2. Elevated prolactin level Normal MRI - Prolactin  3. Encounter for surveillance of contraceptive pills Doing well - drospirenone-ethinyl estradiol (NIKKI) 3-0.02 MG tablet; Take 1 tablet by mouth daily.  Dispense: 84 tablet; Refill: 3  4. Screening examination for STD (sexually transmitted disease) Declines blood work. - SURESWAB CT/NG/T. vaginalis

## 2021-11-23 ENCOUNTER — Encounter: Payer: Self-pay | Admitting: Obstetrics and Gynecology

## 2021-11-23 ENCOUNTER — Ambulatory Visit (INDEPENDENT_AMBULATORY_CARE_PROVIDER_SITE_OTHER): Payer: BC Managed Care – PPO | Admitting: Obstetrics and Gynecology

## 2021-11-23 ENCOUNTER — Other Ambulatory Visit: Payer: Self-pay

## 2021-11-23 VITALS — BP 110/62 | HR 90 | Ht 69.0 in | Wt 129.0 lb

## 2021-11-23 DIAGNOSIS — R7989 Other specified abnormal findings of blood chemistry: Secondary | ICD-10-CM

## 2021-11-23 DIAGNOSIS — Z3041 Encounter for surveillance of contraceptive pills: Secondary | ICD-10-CM | POA: Diagnosis not present

## 2021-11-23 DIAGNOSIS — Z113 Encounter for screening for infections with a predominantly sexual mode of transmission: Secondary | ICD-10-CM

## 2021-11-23 DIAGNOSIS — Z01419 Encounter for gynecological examination (general) (routine) without abnormal findings: Secondary | ICD-10-CM

## 2021-11-23 LAB — PROLACTIN: Prolactin: 20.1 ng/mL

## 2021-11-23 MED ORDER — DROSPIRENONE-ETHINYL ESTRADIOL 3-0.02 MG PO TABS: 1.0000 | ORAL_TABLET | Freq: Every day | ORAL | 3 refills | Status: DC

## 2021-11-23 NOTE — Patient Instructions (Signed)

## 2021-11-25 LAB — SURESWAB CT/NG/T. VAGINALIS
C. trachomatis RNA, TMA: NOT DETECTED
N. gonorrhoeae RNA, TMA: NOT DETECTED
Trichomonas vaginalis RNA: NOT DETECTED

## 2022-11-16 NOTE — Progress Notes (Signed)
26 y.o. G0P0000 Single White or Caucasian Not Hispanic or Latino female here for annual exam.  On OCP's. No dyspareunia. Lives with her long term partner.  Period Cycle (Days): 28 Period Duration (Days): 4 Period Pattern: Regular Menstrual Flow: Moderate Menstrual Control: Thin pad Menstrual Control Change Freq (Hours): 6 Dysmenorrhea: None  H/O elevated prolactin level, normal MRI, needs yearly blood work. No breast d/c for a couple of years.   Patient's last menstrual period was 11/10/2022.          Sexually active: Yes.    The current method of family planning is OCP (estrogen/progesterone).    Exercising: Yes.     Walking and weights  Smoker:  no  Health Maintenance: Pap:   09-20-2019 negative  History of abnormal Pap:  no MMG:  none  BMD:   none  Colonoscopy:  ~2014 normal per patient. Done for "stomach issues" TDaP:  03/19/10, declines  Gardasil: complete    reports that she has never smoked. She has never used smokeless tobacco. She reports current alcohol use of about 3.0 - 4.0 standard drinks of alcohol per week. She reports that she does not use drugs. 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   No past medical history on file.  No past surgical history on file.  Current Outpatient Medications  Medication Sig Dispense Refill   drospirenone-ethinyl estradiol (NIKKI) 3-0.02 MG tablet Take 1 tablet by mouth daily. 84 tablet 3   No current facility-administered medications for this visit.    Family History  Problem Relation Age of Onset   Cancer Maternal Grandmother    Cancer Maternal Grandfather    Diabetes Paternal Grandmother     Review of Systems  All other systems reviewed and are negative.   Exam:   BP 110/62   Pulse 84   Ht 5\' 9"  (1.753 m)   Wt 130 lb (59 kg)   LMP 11/10/2022   SpO2 100%   BMI 19.20 kg/m   Weight change: @WEIGHTCHANGE @ Height:   Height: 5\' 9"  (175.3 cm)  Ht Readings from Last 3 Encounters:  11/24/22 5\' 9"  (1.753 m)   11/23/21 5\' 9"  (1.753 m)  11/14/20 5' 9.5" (1.765 m)    General appearance: alert, cooperative and appears stated age Head: Normocephalic, without obvious abnormality, atraumatic Neck: no adenopathy, supple, symmetrical, trachea midline and thyroid normal to inspection and palpation Lungs: clear to auscultation bilaterally Cardiovascular: regular rate and rhythm Breasts: normal appearance, no masses or tenderness Abdomen: soft, non-tender; non distended,  no masses,  no organomegaly Extremities: extremities normal, atraumatic, no cyanosis or edema Skin: Skin color, texture, turgor normal. No rashes or lesions Lymph nodes: Cervical, supraclavicular, and axillary nodes normal. No abnormal inguinal nodes palpated Neurologic: Grossly normal   Pelvic: External genitalia:  no lesions              Urethra:  normal appearing urethra with no masses, tenderness or lesions              Bartholins and Skenes: normal                 Vagina: normal appearing vagina with normal color and discharge, no lesions              Cervix: no lesions               Bimanual Exam:  Uterus:  normal size, contour, position, consistency, mobility, non-tender and anteverted  Adnexa: no mass, fullness, tenderness               Rectovaginal: Confirms               Anus:  normal sphincter tone, no lesions  Carolynn Serve, CMA chaperoned for the exam.  1. Well woman exam Discussed breast self exam Discussed calcium and vit D intake Declines blood work  2. Encounter for surveillance of contraceptive pills Doing well - drospirenone-ethinyl estradiol (NIKKI) 3-0.02 MG tablet; Take 1 tablet by mouth daily.  Dispense: 84 tablet; Refill: 3  3. Elevated prolactin level Declines blood work  4. Screening for cervical cancer - Cytology - PAP  5. Screening examination for STD (sexually transmitted disease) Declines blood work - Cytology - PAP

## 2022-11-24 ENCOUNTER — Ambulatory Visit (INDEPENDENT_AMBULATORY_CARE_PROVIDER_SITE_OTHER): Payer: BC Managed Care – PPO | Admitting: Obstetrics and Gynecology

## 2022-11-24 ENCOUNTER — Other Ambulatory Visit (HOSPITAL_COMMUNITY)
Admission: RE | Admit: 2022-11-24 | Discharge: 2022-11-24 | Disposition: A | Discharge status: Home / Self Care | Payer: BC Managed Care – PPO | Source: Ambulatory Visit | Attending: Obstetrics and Gynecology | Admitting: Obstetrics and Gynecology

## 2022-11-24 ENCOUNTER — Encounter: Payer: Self-pay | Admitting: Obstetrics and Gynecology

## 2022-11-24 VITALS — BP 110/62 | HR 84 | Ht 69.0 in | Wt 130.0 lb

## 2022-11-24 DIAGNOSIS — Z124 Encounter for screening for malignant neoplasm of cervix: Secondary | ICD-10-CM

## 2022-11-24 DIAGNOSIS — Z3041 Encounter for surveillance of contraceptive pills: Secondary | ICD-10-CM

## 2022-11-24 DIAGNOSIS — Z113 Encounter for screening for infections with a predominantly sexual mode of transmission: Secondary | ICD-10-CM

## 2022-11-24 DIAGNOSIS — R7989 Other specified abnormal findings of blood chemistry: Secondary | ICD-10-CM

## 2022-11-24 DIAGNOSIS — Z01419 Encounter for gynecological examination (general) (routine) without abnormal findings: Secondary | ICD-10-CM | POA: Diagnosis not present

## 2022-11-24 MED ORDER — DROSPIRENONE-ETHINYL ESTRADIOL 3-0.02 MG PO TABS: 1.0000 | ORAL_TABLET | Freq: Every day | ORAL | 3 refills | Status: DC

## 2022-11-24 NOTE — Patient Instructions (Signed)

## 2022-11-29 LAB — CYTOLOGY - PAP
Chlamydia: NEGATIVE
Comment: NEGATIVE
Comment: NORMAL
Diagnosis: NEGATIVE
Neisseria Gonorrhea: NEGATIVE

## 2023-11-30 ENCOUNTER — Encounter: Payer: Self-pay | Admitting: Obstetrics and Gynecology

## 2023-11-30 DIAGNOSIS — Z3041 Encounter for surveillance of contraceptive pills: Secondary | ICD-10-CM

## 2023-11-30 MED ORDER — DROSPIRENONE-ETHINYL ESTRADIOL 3-0.02 MG PO TABS: 1.0000 | ORAL_TABLET | Freq: Every day | ORAL | 0 refills | Status: DC

## 2023-11-30 NOTE — Telephone Encounter (Signed)
Med refill request: BCPs Last AEX: 11/24/2022-JJ Next AEX: 12/19/2023-GH Last MMG (if hormonal med) Refill authorized: rx pend.

## 2023-12-02 ENCOUNTER — Telehealth: Payer: Self-pay

## 2023-12-02 NOTE — Telephone Encounter (Signed)
Left voicemail for patient regarding her BCP RF.  RX was sent on 1218/24 for Gabrielle Martinez #[id number] to [facility] # [phone].

## 2023-12-02 NOTE — Telephone Encounter (Signed)
Pt LVM stating that she needs refill on BCPs.

## 2023-12-19 ENCOUNTER — Ambulatory Visit (INDEPENDENT_AMBULATORY_CARE_PROVIDER_SITE_OTHER): Payer: BC Managed Care – PPO | Admitting: Obstetrics and Gynecology

## 2023-12-19 ENCOUNTER — Encounter: Payer: Self-pay | Admitting: Obstetrics and Gynecology

## 2023-12-19 VITALS — BP 110/64 | HR 99 | Ht 69.0 in | Wt 127.0 lb

## 2023-12-19 DIAGNOSIS — Z01419 Encounter for gynecological examination (general) (routine) without abnormal findings: Secondary | ICD-10-CM | POA: Insufficient documentation

## 2023-12-19 DIAGNOSIS — Z3041 Encounter for surveillance of contraceptive pills: Secondary | ICD-10-CM | POA: Insufficient documentation

## 2023-12-19 MED ORDER — DROSPIRENONE-ETHINYL ESTRADIOL 3-0.02 MG PO TABS: 1.0000 | ORAL_TABLET | Freq: Every day | ORAL | 3 refills | Status: AC

## 2023-12-19 NOTE — Progress Notes (Signed)
 28 y.o. G0P0000 female on COC here for annual exam. Single, boyfriend x 4 years.  Associate professor at [employer].  From [place] .  Patient's last menstrual period was 12/06/2023 (approximate). Period Duration (Days): 4 Period Pattern: Regular Menstrual Flow: Light Menstrual Control: Maxi pad Dysmenorrhea: None  Abnormal bleeding: none Pelvic discharge or pain: none Breast mass, nipple discharge or skin changes : none Birth control: COC Last PAP:     Component Value Date/Time   DIAGPAP  11/24/2022 1633    - Negative for intraepithelial lesion or malignancy (NILM)   DIAGPAP  09/20/2019 1008    - Negative for intraepithelial lesion or malignancy (NILM)   ADEQPAP  11/24/2022 1633    Satisfactory for evaluation; transformation zone component PRESENT.   ADEQPAP  09/20/2019 1008    Satisfactory for evaluation; transformation zone component PRESENT.   Gardasil: completed Sexually active: Yes Exercising: Yes, Walk and weights x 3/week Smoker: No  GYN HISTORY: No significant history  OB History  Gravida Para Term Preterm AB Living  0 0 0 0 0 0  SAB IAB Ectopic Multiple Live Births  0 0 0 0 0    History reviewed. No pertinent past medical history.  History reviewed. No pertinent surgical history.  No current outpatient medications on file prior to visit.   No current facility-administered medications on file prior to visit.    Social History   Socioeconomic History   Marital status: Single    Spouse name: Not on file   Number of children: Not on file   Years of education: Not on file   Highest education level: Not on file  Occupational History   Not on file  Tobacco Use   Smoking status: Never   Smokeless tobacco: Never  Vaping Use   Vaping status: Never Used  Substance and Sexual Activity   Alcohol use: Yes    Alcohol/week: 3.0 - 4.0 standard drinks of alcohol    Types: 3 - 4 Standard drinks or equivalent per week   Drug use: Never   Sexual  activity: Yes    Birth control/protection: Pill  Other Topics Concern   Not on file  Social History Narrative   Not on file   Social Drivers of Health   Financial Resource Strain: Low Risk  (03/25/2023)   Received from [facility]   Overall Financial Resource Strain (CARDIA)    Difficulty of Paying Living Expenses: Not hard at all  Food Insecurity: No Food Insecurity (03/25/2023)   Received from [facility]   Hunger Vital Sign    Worried About Running Out of Food in the Last Year: Never true    Ran Out of Food in the Last Year: Never true  Transportation Needs: No Transportation Needs (03/25/2023)   Received from [facility] - Transportation    Lack of Transportation (Medical): No    Lack of Transportation (Non-Medical): No  Physical Activity: Not on file  Stress: Not on file  Social Connections: Unknown (03/16/2023)   Received from [facility]   Social Network    Social Network: Not on file  Intimate Partner Violence: Unknown (03/16/2023)   Received from [facility]   HITS    Physically Hurt: Not on file    Insult or Talk Down To: Not on file    Threaten Physical Harm: Not on file    Scream or Curse: Not on file    Family History  Problem Relation Age of Onset  Cancer Maternal Grandmother    Cancer Maternal Grandfather    Diabetes Paternal Grandmother     No Known Allergies    PE Today's Vitals   12/19/23 1532  BP: 110/64  Pulse: 99  SpO2: 98%  Weight: 127 lb (57.6 kg)  Height: 5' 9 (1.753 m)   Body mass index is 18.75 kg/m.  Physical Exam Vitals reviewed. Exam conducted with a chaperone present.  Constitutional:      General: She is not in acute distress.    Appearance: Normal appearance.  HENT:     Head: Normocephalic and atraumatic.     Nose: Nose normal.  Eyes:     Extraocular Movements: Extraocular movements intact.     Conjunctiva/sclera: Conjunctivae normal.  Neck:     Thyroid: No thyroid mass, thyromegaly or thyroid  tenderness.  Pulmonary:     Effort: Pulmonary effort is normal.  Chest:     Chest wall: No mass or tenderness.  Breasts:    Right: Normal. No swelling, mass, nipple discharge, skin change or tenderness.     Left: Normal. No swelling, mass, nipple discharge, skin change or tenderness.  Abdominal:     General: There is no distension.     Palpations: Abdomen is soft.     Tenderness: There is no abdominal tenderness.  Genitourinary:    General: Normal vulva.     Exam position: Lithotomy position.     Urethra: No prolapse.     Vagina: Normal. No vaginal discharge or bleeding.     Cervix: Normal. No lesion.     Uterus: Normal. Not enlarged and not tender.      Adnexa: Right adnexa normal and left adnexa normal.  Musculoskeletal:        General: Normal range of motion.     Cervical back: Normal range of motion.  Lymphadenopathy:     Upper Body:     Right upper body: No axillary adenopathy.     Left upper body: No axillary adenopathy.     Lower Body: No right inguinal adenopathy. No left inguinal adenopathy.  Skin:    General: Skin is warm and dry.  Neurological:     General: No focal deficit present.     Mental Status: She is alert.  Psychiatric:        Mood and Affect: Mood normal.        Behavior: Behavior normal.       Assessment and Plan:        Well woman exam with routine gynecological exam Assessment & Plan: Cervical cancer screening performed according to ASCCP guidelines. Labs and immunizations with her primary Encouraged safe sexual practices as indicated.  Declined STD screening. Encouraged healthy lifestyle practices with diet and exercise For patients under 50yo, I recommend 1000mg  calcium daily and 600IU of vitamin D daily.    Oral contraceptive pill surveillance -     Drospirenone -Ethinyl Estradiol ; Take 1 tablet by mouth daily.  Dispense: 84 tablet; Refill: 3    Vera LULLA Pa, MD

## 2023-12-19 NOTE — Assessment & Plan Note (Addendum)
 Cervical cancer screening performed according to ASCCP guidelines. Labs and immunizations with her primary Encouraged safe sexual practices as indicated.  Declined STD screening. Encouraged healthy lifestyle practices with diet and exercise For patients under 26yo, I recommend 1000mg  calcium daily and 600IU of vitamin D daily.

## 2023-12-19 NOTE — Patient Instructions (Signed)

## 2024-12-20 ENCOUNTER — Ambulatory Visit: Payer: BC Managed Care – PPO | Admitting: Obstetrics and Gynecology

## 2025-01-02 ENCOUNTER — Ambulatory Visit: Admitting: Obstetrics and Gynecology
